# Patient Record
Sex: Male | Born: 1995 | Race: Black or African American | Hispanic: No | Marital: Single | Smoking: Current every day smoker
Health system: Southern US, Community
[De-identification: ages and names within clinical notes are randomized; demographics above are authoritative.]

## PROBLEM LIST (undated history)

## (undated) HISTORY — PX: HERNIA REPAIR: SHX51

---
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
## 2016-09-30 ENCOUNTER — Emergency Department
Admission: EM | Admit: 2016-09-30 | Discharge: 2016-10-01 | Disposition: A | Discharge status: Home / Self Care | Payer: Medicaid Other | Attending: Emergency Medicine | Admitting: Emergency Medicine

## 2016-09-30 ENCOUNTER — Emergency Department: Payer: Medicaid Other

## 2016-09-30 ENCOUNTER — Encounter: Payer: Self-pay | Admitting: Emergency Medicine

## 2016-09-30 DIAGNOSIS — Y92410 Unspecified street and highway as the place of occurrence of the external cause: Secondary | ICD-10-CM | POA: Insufficient documentation

## 2016-09-30 DIAGNOSIS — S0990XA Unspecified injury of head, initial encounter: Secondary | ICD-10-CM | POA: Diagnosis present

## 2016-09-30 DIAGNOSIS — Z79899 Other long term (current) drug therapy: Secondary | ICD-10-CM | POA: Insufficient documentation

## 2016-09-30 DIAGNOSIS — S8002XA Contusion of left knee, initial encounter: Secondary | ICD-10-CM | POA: Diagnosis not present

## 2016-09-30 DIAGNOSIS — F17210 Nicotine dependence, cigarettes, uncomplicated: Secondary | ICD-10-CM | POA: Diagnosis not present

## 2016-09-30 DIAGNOSIS — T07XXXA Unspecified multiple injuries, initial encounter: Secondary | ICD-10-CM

## 2016-09-30 DIAGNOSIS — S63616A Unspecified sprain of right little finger, initial encounter: Secondary | ICD-10-CM

## 2016-09-30 DIAGNOSIS — F1290 Cannabis use, unspecified, uncomplicated: Secondary | ICD-10-CM | POA: Insufficient documentation

## 2016-09-30 DIAGNOSIS — S51012A Laceration without foreign body of left elbow, initial encounter: Secondary | ICD-10-CM | POA: Insufficient documentation

## 2016-09-30 DIAGNOSIS — Z23 Encounter for immunization: Secondary | ICD-10-CM | POA: Diagnosis not present

## 2016-09-30 DIAGNOSIS — Y939 Activity, unspecified: Secondary | ICD-10-CM | POA: Insufficient documentation

## 2016-09-30 DIAGNOSIS — Y999 Unspecified external cause status: Secondary | ICD-10-CM | POA: Insufficient documentation

## 2016-09-30 DIAGNOSIS — S0101XA Laceration without foreign body of scalp, initial encounter: Secondary | ICD-10-CM | POA: Insufficient documentation

## 2016-09-30 DIAGNOSIS — S0093XA Contusion of unspecified part of head, initial encounter: Secondary | ICD-10-CM

## 2016-09-30 MED ORDER — ACETAMINOPHEN 500 MG PO TABS
1000.0000 mg | ORAL_TABLET | Freq: Once | ORAL | Status: AC
  Administered 2016-10-01: 1000 mg via ORAL
  Filled 2016-09-30: qty 2

## 2016-09-30 NOTE — ED Notes (Signed)
Pt also noted to have a gash on right side of forehead at his time.
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
## 2016-10-01 MED ORDER — BACITRACIN ZINC 500 UNIT/GM EX OINT
TOPICAL_OINTMENT | CUTANEOUS | Status: AC
  Filled 2016-10-01: qty 0.9

## 2016-10-01 MED ORDER — TETANUS-DIPHTH-ACELL PERTUSSIS 5-2.5-18.5 LF-MCG/0.5 IM SUSP
0.5000 mL | Freq: Once | INTRAMUSCULAR | Status: AC
  Administered 2016-10-01: 0.5 mL via INTRAMUSCULAR
  Filled 2016-10-01: qty 0.5

## 2016-10-01 NOTE — ED Notes (Signed)
Pt. Going home with friend 

## 2016-10-01 NOTE — ED Notes (Signed)
Pt. Very anxious to go home, discharge and discharge paperwork given.

## 2016-10-01 NOTE — Discharge Instructions (Signed)

## 2017-05-05 IMAGING — CT CT RENAL STONE PROTOCOL
3 of 4 series · 9 of 46 positions shown, 16 images · non-contrast
Comparison: None.

CLINICAL DATA: Acute right flank pain, microhematuria.

EXAM:
CT ABDOMEN AND PELVIS WITHOUT CONTRAST
TECHNIQUE: Multidetector CT imaging of the abdomen and pelvis was performed
following the standard protocol without IV contrast.

[Series 4: lung · axial · 0.61mm/px · z∈[-755,-675]mm · 5 of 25 slices shown, 10 images]
[im 5/25  soft-tissue]
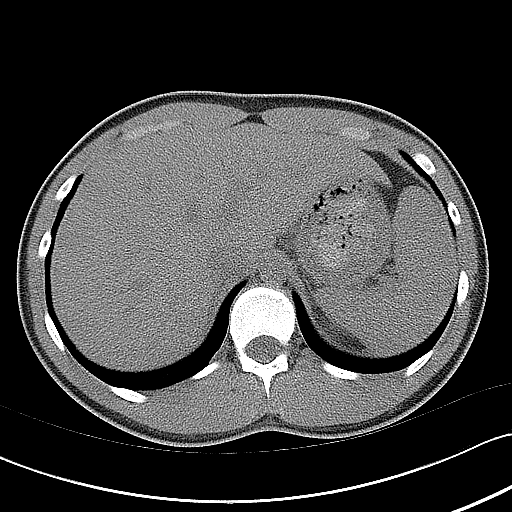
[im 5/25  bone]
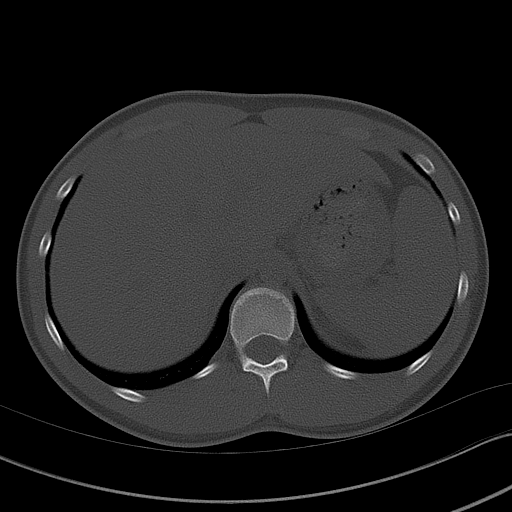
[im 9/25  soft-tissue]
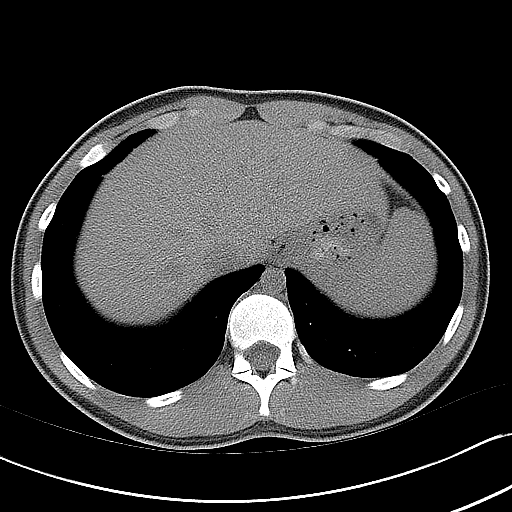
[im 9/25  lung]
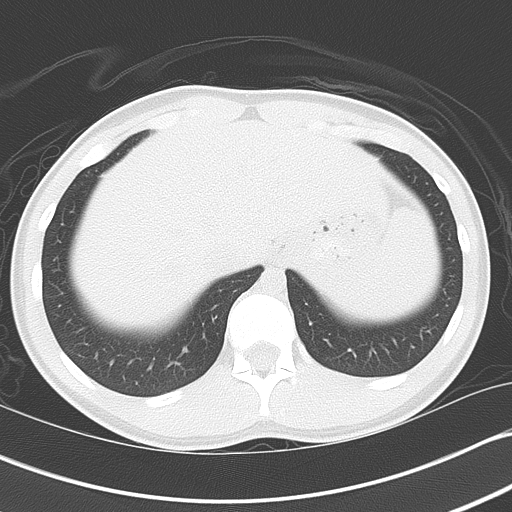
[im 13/25  soft-tissue]
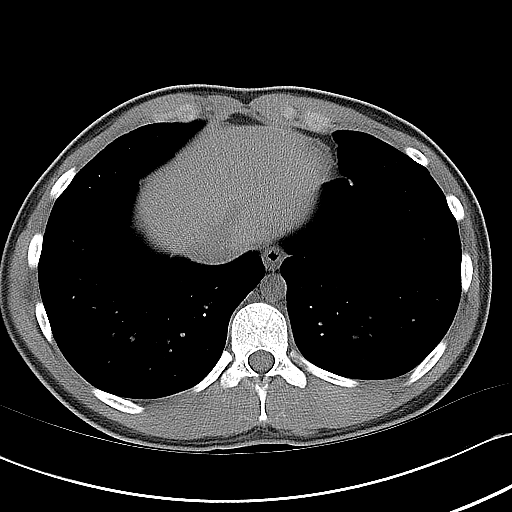
[im 13/25  lung]
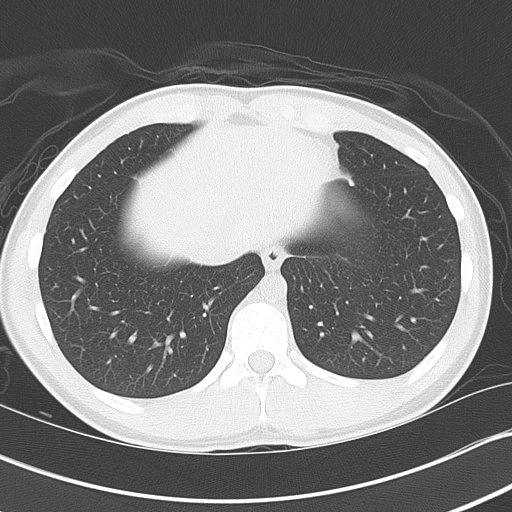
[im 17/25  soft-tissue]
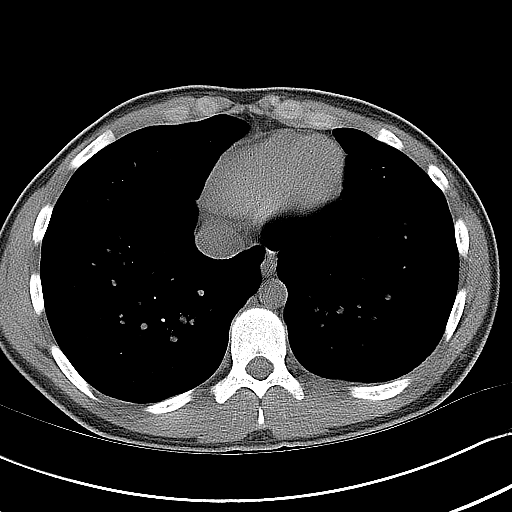
[im 17/25  lung]
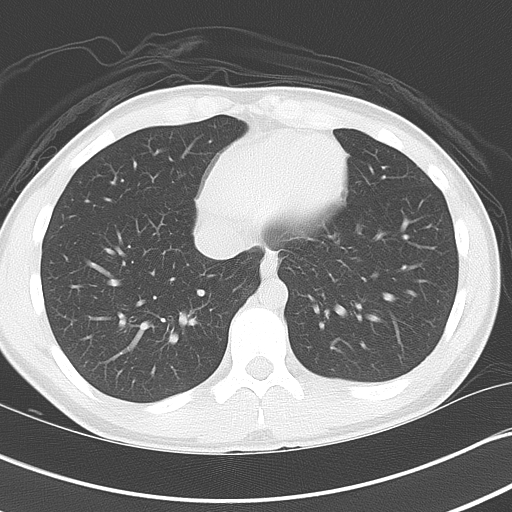
[im 21/25  soft-tissue]
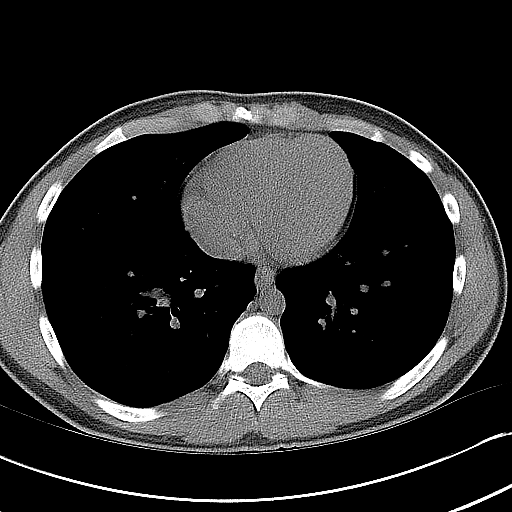
[im 21/25  lung]
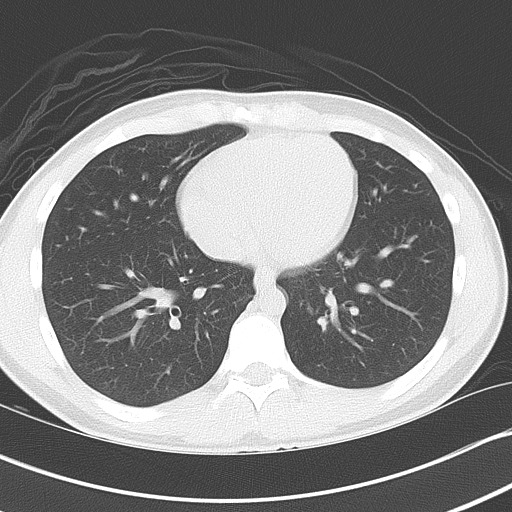

[Series 5: coronal · coronal · 0.64mm/px · 3 of 110 slices shown, 4 images]
[im 37/110  soft-tissue]
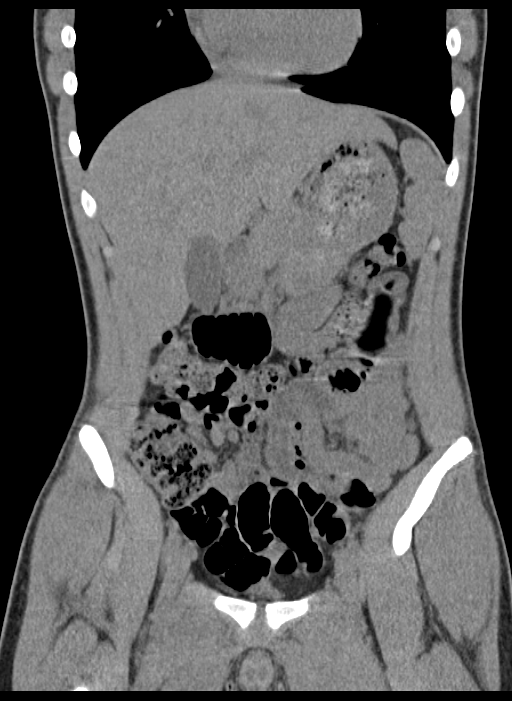
[im 49/110  soft-tissue]
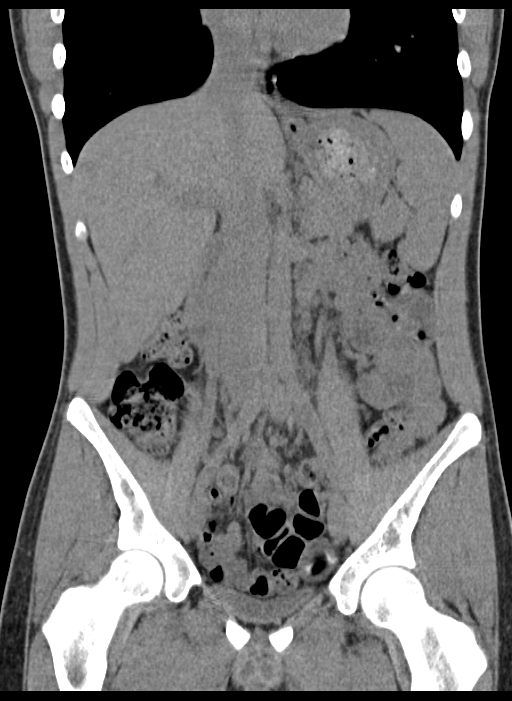
[im 49/110  bone]
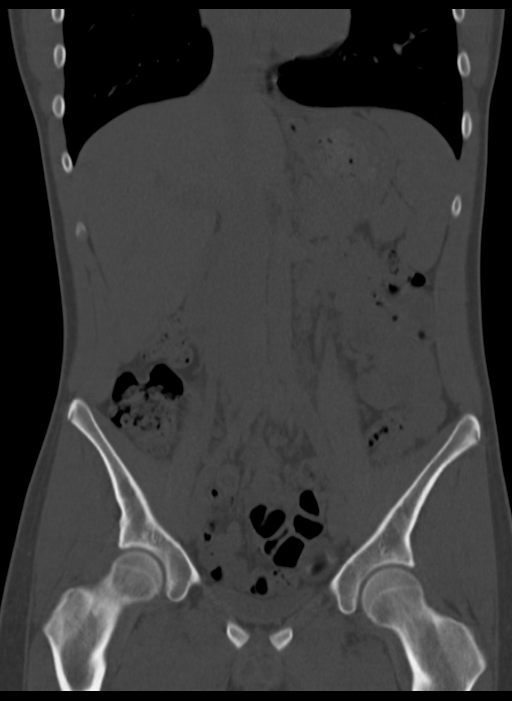
[im 61/110  soft-tissue]
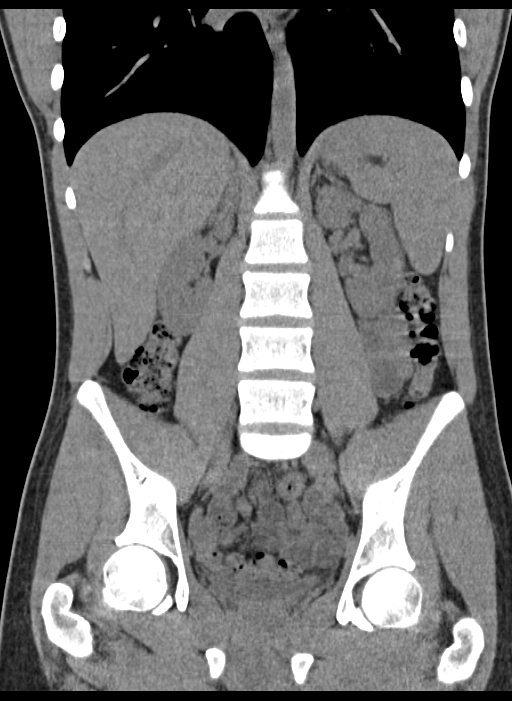

[Series 6: sagittal · sagittal · 0.52mm/px · 1 of 155 slices shown, 2 images]
[im 52/155  soft-tissue]
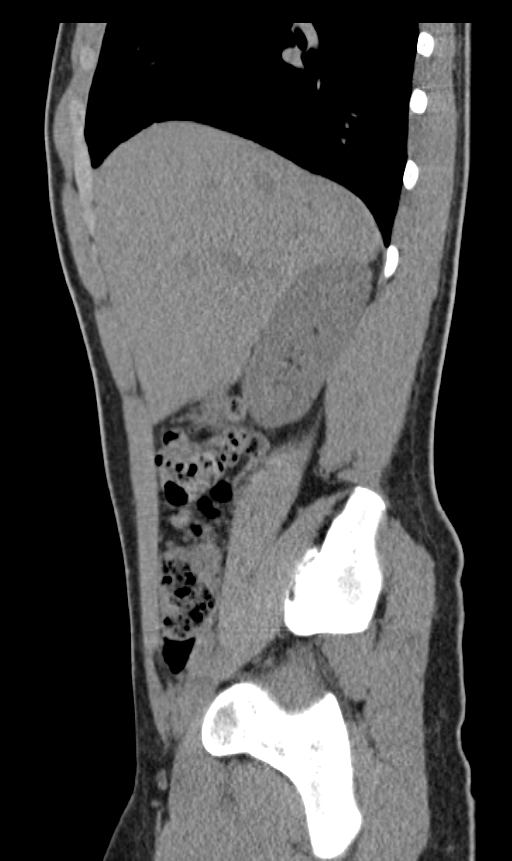
[im 52/155  bone]
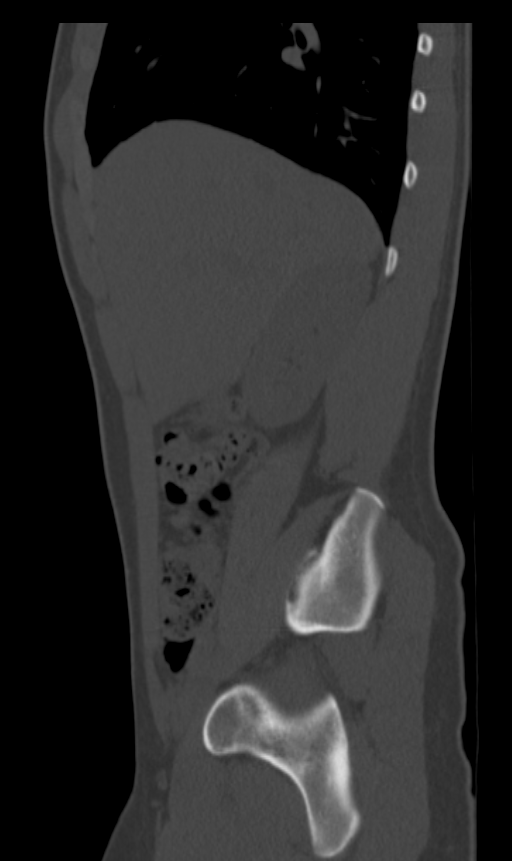

[9 of 46 positions shown; findings below may reference images not displayed]

FINDINGS: Visualized lung bases are unremarkable. No significant osseous
abnormality is noted.

No gallstones are noted. No focal abnormality seen in the liver,
spleen or pancreas on these unenhanced images. Adrenal glands and
kidneys appear normal. No hydronephrosis or renal obstruction is
noted. No renal or ureteral calculi are noted. The appendix appears
normal. There is no evidence of bowel obstruction. No abnormal fluid
collection is noted. Urinary bladder is decompressed. No significant
adenopathy is noted.
IMPRESSION: No hydronephrosis or renal obstruction is noted. No renal or
ureteral calculi are noted. No acute abnormality is noted in the
abdomen or pelvis.

## 2017-07-18 IMAGING — US US EXTREM LOW*R* LIMITED
1 series · 14 of 25 positions shown · non-contrast
Comparison: None

CLINICAL DATA: Right groin pain, possible hernia

EXAM:
ULTRASOUND right LOWER EXTREMITY LIMITED
TECHNIQUE: Ultrasound examination of the lower extremity soft tissues was
performed in the area of clinical concern.

[Series 1: us extrem low*right* limited · 0.08mm/px · 14 of 71 slices shown]
[im 1/71]
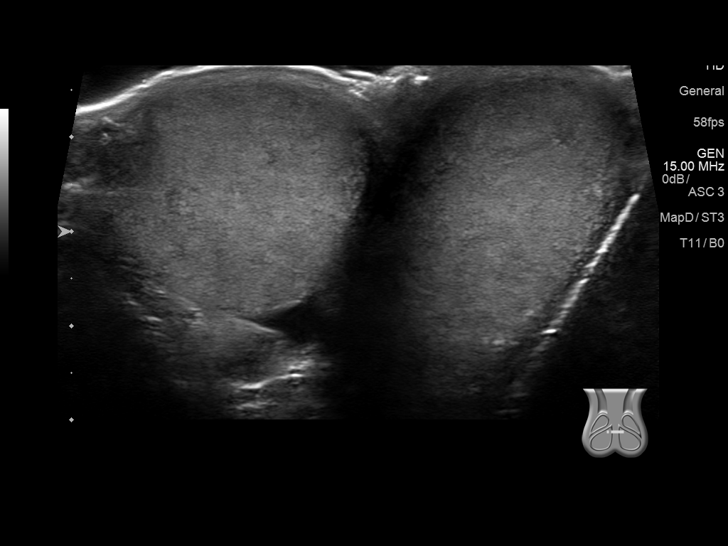
[im 6/71]
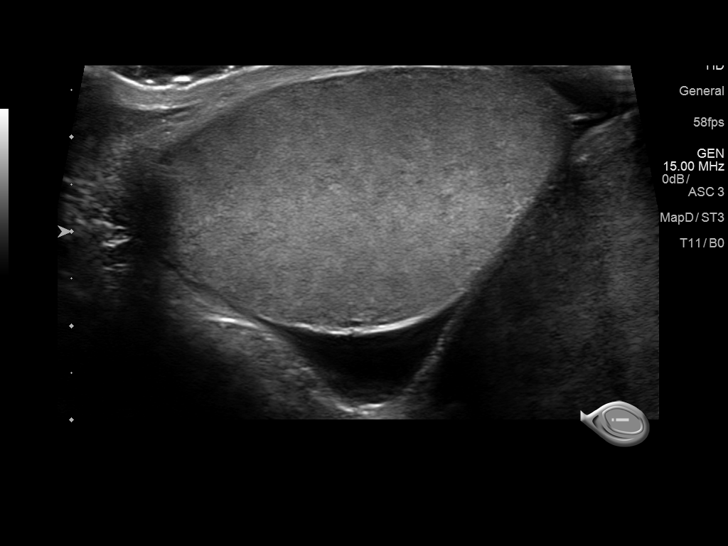
[im 12/71]
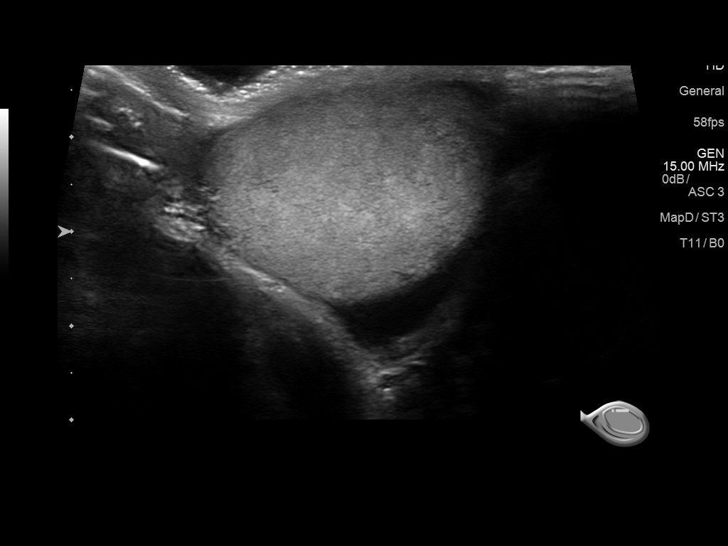
[im 18/71]
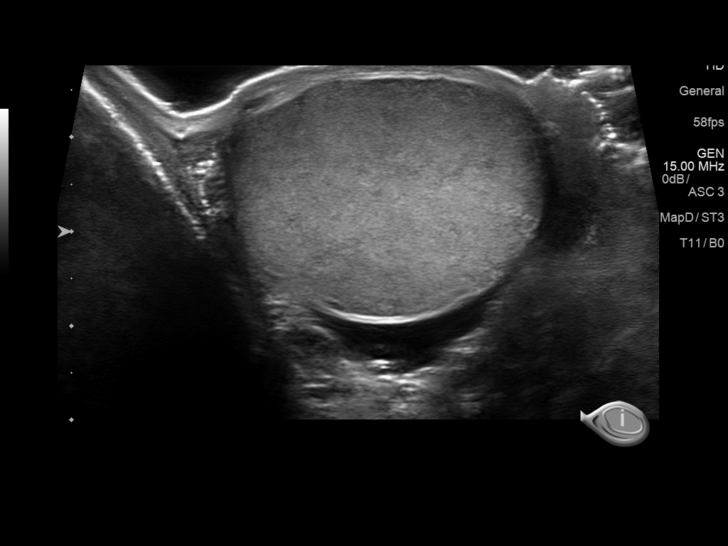
[im 24/71]
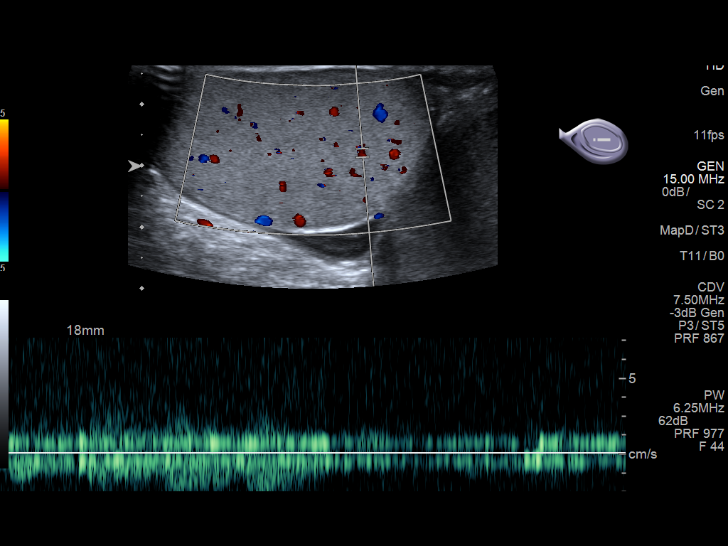
[im 27/71]
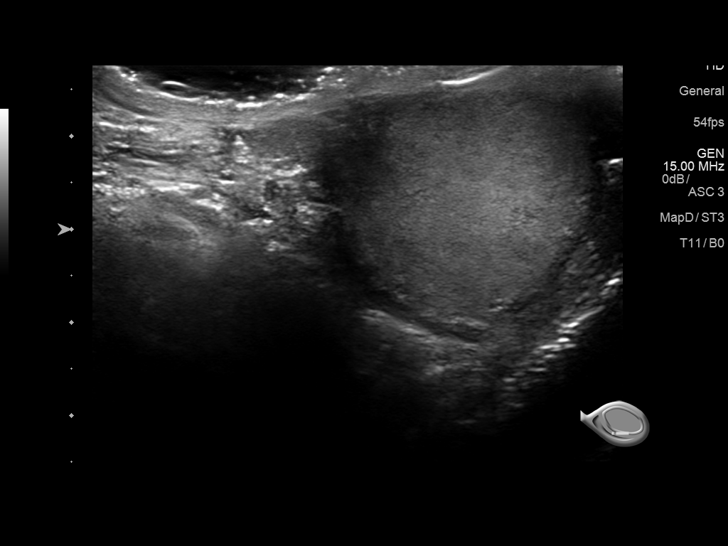
[im 33/71]
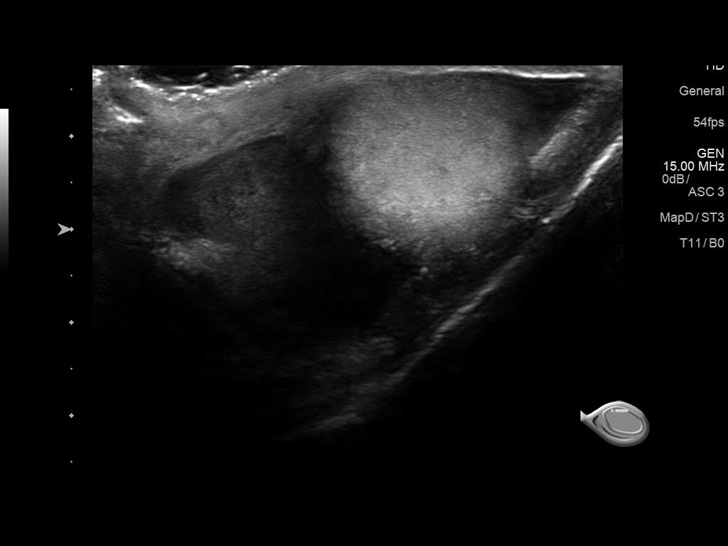
[im 38/71]
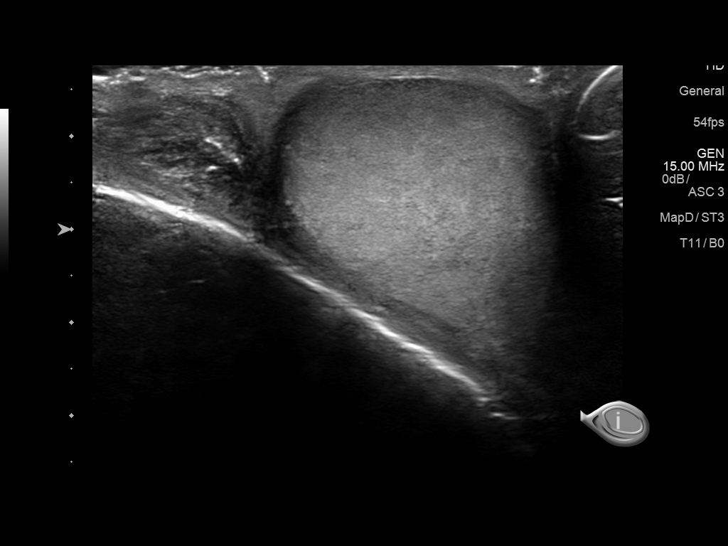
[im 44/71]
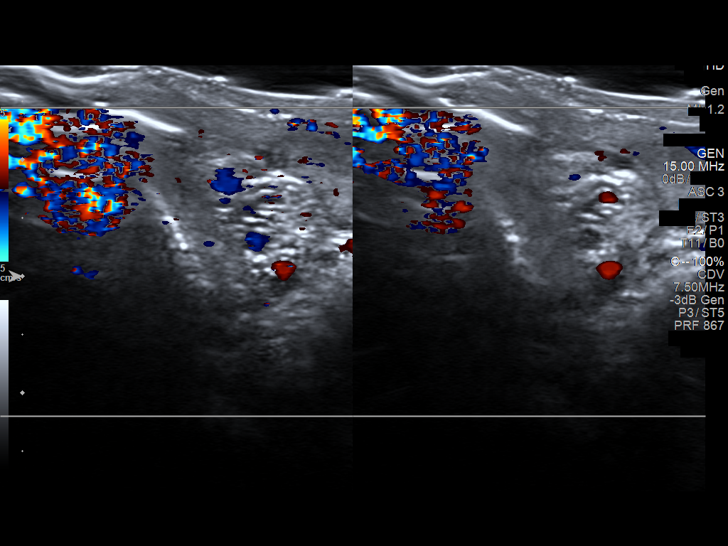
[im 47/71]
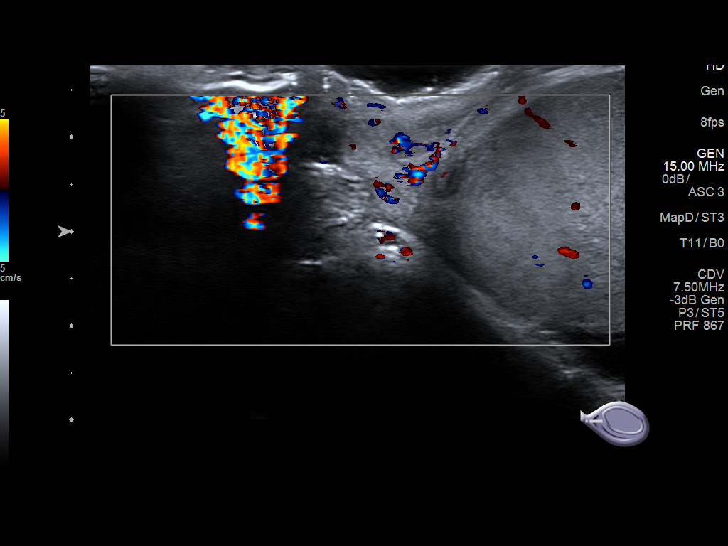
[im 53/71]
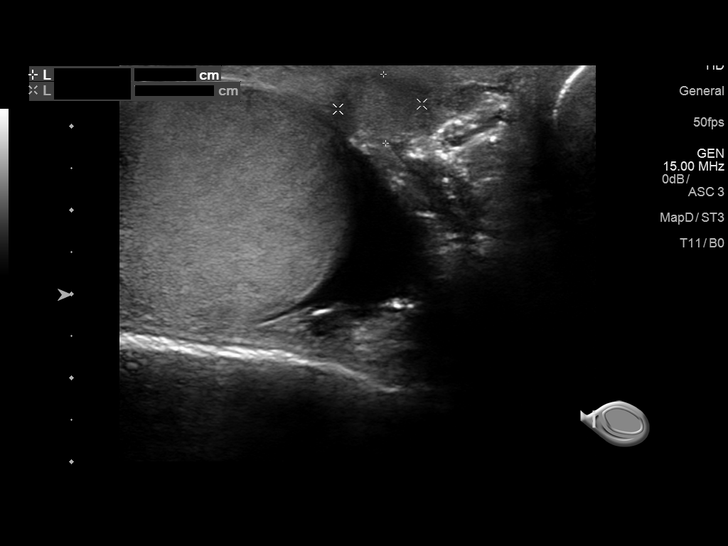
[im 59/71]
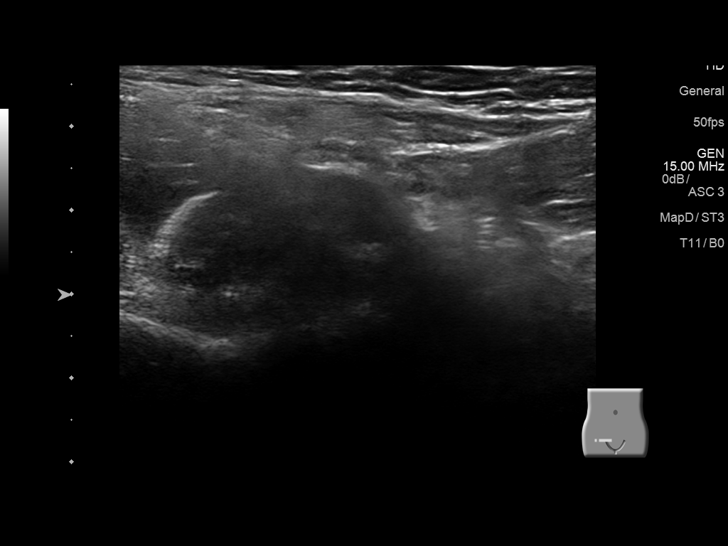
[im 65/71]
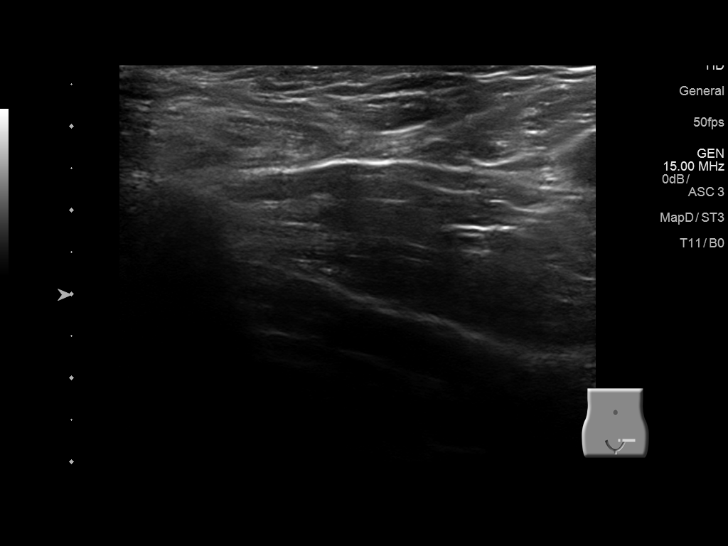
[im 71/71]
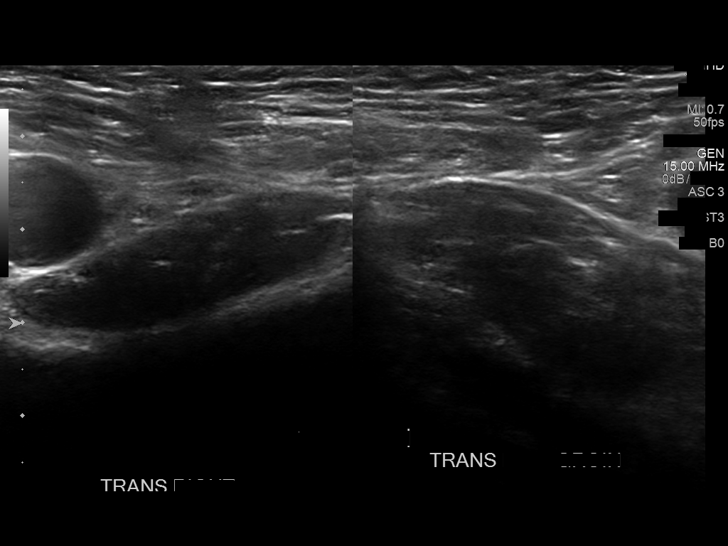

[14 of 25 positions shown; findings below may reference images not displayed]

FINDINGS: Evaluation of the right inguinal region shows no evidence of
inguinal hernia. No other focal abnormality is noted.
IMPRESSION: No hernia is identified.

## 2021-06-27 ENCOUNTER — Other Ambulatory Visit: Payer: Self-pay

## 2021-06-27 ENCOUNTER — Encounter: Payer: Self-pay | Admitting: Emergency Medicine

## 2021-06-27 ENCOUNTER — Ambulatory Visit
Admission: EM | Admit: 2021-06-27 | Discharge: 2021-06-27 | Disposition: A | Discharge status: Home / Self Care | Payer: 59 | Attending: Emergency Medicine | Admitting: Emergency Medicine

## 2021-06-27 DIAGNOSIS — R519 Headache, unspecified: Secondary | ICD-10-CM

## 2021-06-27 DIAGNOSIS — R051 Acute cough: Secondary | ICD-10-CM

## 2021-06-27 DIAGNOSIS — J111 Influenza due to unidentified influenza virus with other respiratory manifestations: Secondary | ICD-10-CM

## 2021-06-27 MED ORDER — PREDNISONE 10 MG (21) PO TBPK: ORAL_TABLET | Freq: Every day | ORAL | 0 refills | Status: AC

## 2021-06-27 MED ORDER — OSELTAMIVIR PHOSPHATE 75 MG PO CAPS: 75.0000 mg | ORAL_CAPSULE | Freq: Two times a day (BID) | ORAL | 0 refills | Status: AC

## 2021-06-27 MED ORDER — ALBUTEROL SULFATE HFA 108 (90 BASE) MCG/ACT IN AERS: 1.0000 | INHALATION_SPRAY | Freq: Four times a day (QID) | RESPIRATORY_TRACT | 0 refills | Status: AC | PRN

## 2021-06-27 NOTE — Discharge Instructions (Signed)
Cont to take tylenol or motrin as needed for pain or fever  Can take day quil for sx also  These sx can linger for several days  Stay hydrated well  If sx become worse go to er

## 2021-06-27 NOTE — ED Provider Notes (Signed)
[facility]    CSN: [id number] Arrival date & time: 06/27/21  1046      History   Chief Complaint Chief Complaint  Patient presents with   Headache   Cough   Fever    HPI Matthew Arnold is a 25 y.o. male.   Pt here for fever, cough ,congestion, headache and body aches for 2 days. States that he feels bad and took otc meds with no relief. Daughter has been sick with same illness.    History reviewed. No pertinent past medical history.  There are no problems to display for this patient.   Past Surgical History:  Procedure Laterality Date   HERNIA REPAIR         Home Medications    Prior to Admission medications   Medication Sig Start Date End Date Taking? Authorizing Provider  albuterol (VENTOLIN HFA) 108 (90 Base) MCG/ACT inhaler Inhale 1-2 puffs into the lungs every 6 (six) hours as needed for wheezing or shortness of breath. 06/27/21  Yes Coralyn Mark, NP  oseltamivir (TAMIFLU) 75 MG capsule Take 1 capsule (75 mg total) by mouth every 12 (twelve) hours. 06/27/21  Yes Coralyn Mark, NP  predniSONE (STERAPRED UNI-PAK 21 TAB) 10 MG (21) TBPK tablet Take by mouth daily. Take 6 tabs by mouth daily  for 2 days, then 5 tabs for 2 days, then 4 tabs for 2 days, then 3 tabs for 2 days, 2 tabs for 2 days, then 1 tab by mouth daily for 2 days 06/27/21  Yes Coralyn Mark, NP  naproxen (NAPROSYN) 500 MG tablet Take 1 tablet (500 mg total) by mouth 2 (two) times daily with a meal. 12/29/15   Sharman Cheek, MD  ondansetron (ZOFRAN ODT) 4 MG disintegrating tablet Take 1 tablet (4 mg total) by mouth every 8 (eight) hours as needed for nausea or vomiting. 12/29/15   Sharman Cheek, MD  oxyCODONE-acetaminophen (ROXICET) 5-325 MG tablet Take 1 tablet by mouth every 6 (six) hours as needed for severe pain. 12/29/15   Sharman Cheek, MD    Family History History reviewed. No pertinent family history.  Social History Social History   Tobacco Use    Smoking status: Every Day    Packs/day: 0.50    Types: Cigarettes   Smokeless tobacco: Never  Substance Use Topics   Alcohol use: No   Drug use: Yes    Types: Marijuana     Allergies   Patient has no known allergies.   Review of Systems Review of Systems  Constitutional:  Positive for appetite change, chills, fatigue and fever.  HENT:  Positive for congestion, postnasal drip, rhinorrhea, sinus pressure, sinus pain, sneezing and sore throat.   Eyes: Negative.   Respiratory:  Positive for cough and shortness of breath.   Cardiovascular: Negative.   Gastrointestinal: Negative.   Genitourinary: Negative.   Musculoskeletal:        Bodyaches all over   Skin: Negative.   Neurological:  Positive for headaches.    Physical Exam Triage Vital Signs ED Triage Vitals  Enc Vitals Group     BP 06/27/21 1200 121/74     Pulse Rate 06/27/21 1200 75     Resp 06/27/21 1200 18     Temp 06/27/21 1200 98.6 F (37 C)     Temp Source 06/27/21 1200 Oral     SpO2 06/27/21 1200 100 %     Weight --      Height --  Head Circumference --      Peak Flow --      Pain Score 06/27/21 1158 0     Pain Loc --      Pain Edu? --      Excl. in GC? --    No data found.  Updated Vital Signs BP 121/74 (BP Location: Right Arm)   Pulse 75   Temp 98.6 F (37 C) (Oral)   Resp 18   SpO2 100%   Visual Acuity Right Eye Distance:   Left Eye Distance:   Bilateral Distance:    Right Eye Near:   Left Eye Near:    Bilateral Near:     Physical Exam Constitutional:      Appearance: He is ill-appearing.  HENT:     Nose: Congestion and rhinorrhea present.     Right Turbinates: Enlarged and swollen.     Left Turbinates: Enlarged and swollen.     Mouth/Throat:     Mouth: Mucous membranes are moist.  Eyes:     Pupils: Pupils are equal, round, and reactive to light.  Cardiovascular:     Rate and Rhythm: Normal rate.  Pulmonary:     Effort: Pulmonary effort is normal.  Abdominal:      Palpations: Abdomen is soft.  Musculoskeletal:        General: Normal range of motion.     Cervical back: Normal range of motion.  Skin:    General: Skin is warm.     Capillary Refill: Capillary refill takes less than 2 seconds.  Neurological:     Mental Status: He is alert.     UC Treatments / Results  Labs (all labs ordered are listed, but only abnormal results are displayed) Labs Reviewed - No data to display  EKG   Radiology No results found.  Procedures Procedures (including critical care time)  Medications Ordered in UC Medications - No data to display  Initial Impression / Assessment and Plan / UC Course  I have reviewed the triage vital signs and the nursing notes.  Pertinent labs & imaging results that were available during my care of the patient were reviewed by me and considered in my medical decision making (see chart for details).     Cont to take tylenol or motrin as needed for pain or fever  Can take day quil for sx also  These sx can linger for several days  Stay hydrated well  If sx become worse go to er  Final Clinical Impressions(s) / UC Diagnoses   Final diagnoses:  Influenza-like illness  Nonintractable headache, unspecified chronicity pattern, unspecified headache type  Acute cough     Discharge Instructions      Cont to take tylenol or motrin as needed for pain or fever  Can take day quil for sx also  These sx can linger for several days  Stay hydrated well  If sx become worse go to er      ED Prescriptions     Medication Sig Dispense Auth. Provider   predniSONE (STERAPRED UNI-PAK 21 TAB) 10 MG (21) TBPK tablet Take by mouth daily. Take 6 tabs by mouth daily  for 2 days, then 5 tabs for 2 days, then 4 tabs for 2 days, then 3 tabs for 2 days, 2 tabs for 2 days, then 1 tab by mouth daily for 2 days 42 tablet Maple Mirza L, NP   albuterol (VENTOLIN HFA) 108 (90 Base) MCG/ACT inhaler Inhale 1-2 puffs into the lungs  every 6  (six) hours as needed for wheezing or shortness of breath. 90 g Coralyn Mark, NP   oseltamivir (TAMIFLU) 75 MG capsule Take 1 capsule (75 mg total) by mouth every 12 (twelve) hours. 10 capsule Coralyn Mark, NP      PDMP not reviewed this encounter.   Coralyn Mark, NP 06/27/21 1236

## 2021-06-27 NOTE — ED Triage Notes (Signed)
Cough, headache and fever x 2 days. Took Delsym Cough med approx 10a.

## 2021-08-12 ENCOUNTER — Ambulatory Visit: Payer: 59 | Admitting: Nurse Practitioner

## 2022-11-28 DIAGNOSIS — Z23 Encounter for immunization: Secondary | ICD-10-CM | POA: Diagnosis not present

## 2022-11-30 DIAGNOSIS — Z00110 Health examination for newborn under 8 days old: Secondary | ICD-10-CM | POA: Diagnosis not present
